# Patient Record
Sex: Male | Born: 1985 | Hispanic: No | Smoking: Never smoker
Health system: Southern US, Community
[De-identification: ages and names within clinical notes are randomized; demographics above are authoritative.]

## PROBLEM LIST (undated history)

## (undated) DIAGNOSIS — F319 Bipolar disorder, unspecified: Secondary | ICD-10-CM

## (undated) DIAGNOSIS — E785 Hyperlipidemia, unspecified: Secondary | ICD-10-CM

## (undated) DIAGNOSIS — I10 Essential (primary) hypertension: Secondary | ICD-10-CM

## (undated) HISTORY — PX: HERNIA REPAIR: SHX51

## (undated) HISTORY — PX: TYMPANOSTOMY TUBE PLACEMENT: SHX32

---

## 2008-09-16 ENCOUNTER — Emergency Department: Payer: Self-pay | Admitting: Emergency Medicine

## 2013-11-27 ENCOUNTER — Ambulatory Visit: Payer: Self-pay | Admitting: Family Medicine

## 2013-12-31 ENCOUNTER — Ambulatory Visit: Payer: Self-pay | Admitting: Urology

## 2014-03-15 ENCOUNTER — Encounter (HOSPITAL_COMMUNITY): Payer: Self-pay | Admitting: Emergency Medicine

## 2014-03-15 ENCOUNTER — Emergency Department (HOSPITAL_COMMUNITY)
Admission: EM | Admit: 2014-03-15 | Discharge: 2014-03-16 | Disposition: A | Discharge status: Home / Self Care | Payer: BC Managed Care – PPO | Attending: Dermatology | Admitting: Dermatology

## 2014-03-15 DIAGNOSIS — F332 Major depressive disorder, recurrent severe without psychotic features: Secondary | ICD-10-CM | POA: Diagnosis not present

## 2014-03-15 DIAGNOSIS — F329 Major depressive disorder, single episode, unspecified: Secondary | ICD-10-CM | POA: Diagnosis not present

## 2014-03-15 DIAGNOSIS — F419 Anxiety disorder, unspecified: Secondary | ICD-10-CM

## 2014-03-15 DIAGNOSIS — R45851 Suicidal ideations: Secondary | ICD-10-CM | POA: Diagnosis present

## 2014-03-15 DIAGNOSIS — F411 Generalized anxiety disorder: Secondary | ICD-10-CM | POA: Insufficient documentation

## 2014-03-15 DIAGNOSIS — Z880 Allergy status to penicillin: Secondary | ICD-10-CM | POA: Diagnosis not present

## 2014-03-15 DIAGNOSIS — F39 Unspecified mood [affective] disorder: Secondary | ICD-10-CM | POA: Diagnosis not present

## 2014-03-15 DIAGNOSIS — F3289 Other specified depressive episodes: Secondary | ICD-10-CM | POA: Insufficient documentation

## 2014-03-15 LAB — COMPREHENSIVE METABOLIC PANEL
ALT: 16 U/L (ref 0–53)
AST: 20 U/L (ref 0–37)
Albumin: 4.5 g/dL (ref 3.5–5.2)
Alkaline Phosphatase: 56 U/L (ref 39–117)
Anion gap: 15 (ref 5–15)
BILIRUBIN TOTAL: 0.5 mg/dL (ref 0.3–1.2)
BUN: 6 mg/dL (ref 6–23)
CO2: 24 meq/L (ref 19–32)
Calcium: 8.8 mg/dL (ref 8.4–10.5)
Chloride: 103 mEq/L (ref 96–112)
Creatinine, Ser: 0.94 mg/dL (ref 0.50–1.35)
Glucose, Bld: 86 mg/dL (ref 70–99)
POTASSIUM: 3.7 meq/L (ref 3.7–5.3)
SODIUM: 142 meq/L (ref 137–147)
Total Protein: 7.4 g/dL (ref 6.0–8.3)

## 2014-03-15 LAB — CBC
HCT: 44.9 % (ref 39.0–52.0)
HEMOGLOBIN: 15.5 g/dL (ref 13.0–17.0)
MCH: 29.9 pg (ref 26.0–34.0)
MCHC: 34.5 g/dL (ref 30.0–36.0)
MCV: 86.5 fL (ref 78.0–100.0)
PLATELETS: 265 10*3/uL (ref 150–400)
RBC: 5.19 MIL/uL (ref 4.22–5.81)
RDW: 12.8 % (ref 11.5–15.5)
WBC: 9.1 10*3/uL (ref 4.0–10.5)

## 2014-03-15 LAB — ETHANOL: Alcohol, Ethyl (B): <11 mg/dL (ref 0–11)

## 2014-03-15 LAB — SALICYLATE LEVEL: Salicylate Lvl: <2 mg/dL — ABNORMAL LOW (ref 2.8–20.0)

## 2014-03-15 LAB — ACETAMINOPHEN LEVEL

## 2014-03-15 MED ORDER — LORAZEPAM 1 MG PO TABS: 1.0000 mg | ORAL_TABLET | Freq: Three times a day (TID) | ORAL | Status: DC | PRN

## 2014-03-15 MED ORDER — ACETAMINOPHEN 325 MG PO TABS: 650.0000 mg | ORAL_TABLET | ORAL | Status: DC | PRN

## 2014-03-15 NOTE — ED Notes (Signed)
Pt inquired about time he will be here; pt states he no longer wants to wait asked for his clothes so he can leave.Md notified to come reassess

## 2014-03-15 NOTE — ED Provider Notes (Addendum)
CSN: [id number]     Arrival date & time 03/15/14  1914 History   First MD Initiated Contact with Patient 03/15/14 1950     Chief Complaint  Patient presents with  . Suicidal     (Consider location/radiation/quality/duration/timing/severity/associated sxs/prior Treatment) The history is provided by the patient.  pt c/o anxiety and depression, noting hx of same. States feels worse in the past couple of days, noting recent stressors, but will not provide specifics.  +vague thoughts of harm to self, denies any plan or intent to kill self. No HI.  Occasional etoh use, denies daily or heavy use.  Denies other drug use.  Denies any prescription or otc med use. States physical health recently at baseline. No recent illness. Normal appetite.      History reviewed. No pertinent past medical history. Past Surgical History  Procedure Laterality Date  . Hernia repair     History reviewed. No pertinent family history. History  Substance Use Topics  . Smoking status: Never Smoker   . Smokeless tobacco: Not on file  . Alcohol Use: Yes    Review of Systems  Constitutional: Negative for fever.  HENT: Negative for sore throat.   Eyes: Negative for redness.  Respiratory: Negative for shortness of breath.   Cardiovascular: Negative for chest pain.  Gastrointestinal: Negative for vomiting, abdominal pain and diarrhea.  Genitourinary: Negative for flank pain.  Musculoskeletal: Negative for back pain and neck pain.  Skin: Negative for rash.  Neurological: Negative for headaches.  Hematological: Does not bruise/bleed easily.  Psychiatric/Behavioral: Positive for dysphoric mood. The patient is nervous/anxious.       Allergies  Codeine and Penicillins  Home Medications   Prior to Admission medications   Not on File   BP 130/79  Pulse 60  Temp(Src) 98.9 F (37.2 C) (Oral)  Resp 18  SpO2 100% Physical Exam  Nursing note and vitals reviewed. Constitutional: He is oriented to person,  place, and time. He appears well-developed and well-nourished. No distress.  HENT:  Head: Atraumatic.  Mouth/Throat: Oropharynx is clear and moist.  Eyes: Conjunctivae are normal. Pupils are equal, round, and reactive to light.  Neck: Neck supple. No tracheal deviation present.  Cardiovascular: Normal rate, regular rhythm, normal heart sounds and intact distal pulses.   Pulmonary/Chest: Effort normal and breath sounds normal. No accessory muscle usage. No respiratory distress.  Abdominal: Soft. Bowel sounds are normal. He exhibits no distension. There is no tenderness.  Musculoskeletal: Normal range of motion.  Neurological: He is alert and oriented to person, place, and time.  Steady gait. No tremor or shakes.   Skin: Skin is warm and dry. He is not diaphoretic.  Psychiatric:  Appears mildly anxious. +recent suicidal thoughts, no plan, no attempt to harm self.     ED Course  Procedures (including critical care time) Labs Review  Results for orders placed during the hospital encounter of 03/15/14  ACETAMINOPHEN LEVEL      Result Value Ref Range   Acetaminophen (Tylenol), Serum <15.0  10 - 30 ug/mL  CBC      Result Value Ref Range   WBC 9.1  4.0 - 10.5 K/uL   RBC 5.19  4.22 - 5.81 MIL/uL   Hemoglobin 15.5  13.0 - 17.0 g/dL   HCT 56.2  13.0 - 86.5 %   MCV 86.5  78.0 - 100.0 fL   MCH 29.9  26.0 - 34.0 pg   MCHC 34.5  30.0 - 36.0 g/dL   RDW 12.8  11.5 - 15.5 %   Platelets 265  150 - 400 K/uL  COMPREHENSIVE METABOLIC PANEL      Result Value Ref Range   Sodium 142  137 - 147 mEq/L   Potassium 3.7  3.7 - 5.3 mEq/L   Chloride 103  96 - 112 mEq/L   CO2 24  19 - 32 mEq/L   Glucose, Bld 86  70 - 99 mg/dL   BUN 6  6 - 23 mg/dL   Creatinine, Ser 1.610.94  0.50 - 1.35 mg/dL   Calcium 8.8  8.4 - 09.610.5 mg/dL   Total Protein 7.4  6.0 - 8.3 g/dL   Albumin 4.5  3.5 - 5.2 g/dL   AST 20  0 - 37 U/L   ALT 16  0 - 53 U/L   Alkaline Phosphatase 56  39 - 117 U/L   Total Bilirubin 0.5  0.3 - 1.2  mg/dL   GFR calc non Af Amer >90  >90 mL/min   GFR calc Af Amer >90  >90 mL/min   Anion gap 15  5 - 15  ETHANOL      Result Value Ref Range   Alcohol, Ethyl (B) <11  0 - 11 mg/dL  SALICYLATE LEVEL      Result Value Ref Range   Salicylate Lvl <2.0 (*) 2.8 - 20.0 mg/dL      MDM  Labs.  Reviewed nursing notes and prior charts for additional history.   Psych team consulted.  Recheck, pt content, alert, awaiting psych team eval.   Signed out to oncoming provider, dispo per psych team.  0005 checked on psych team/TTS consult - pt still waiting, informed of delay, nurse called TTS to instruct that they assess as soon as able/first.  Signed out to Dr Nicanor AlconPalumbo that psych eval remains pending, to follow up with their assessment once complete, dispo per psych team.        Suzi RootsKevin E Freddy Kinne, MD 03/16/14 0009

## 2014-03-15 NOTE — ED Notes (Signed)
Presents with thoughts of harming self, began Saturday, endorses stress recently and past thoughts. Denies plan to harm self and plan to harm others. Cooperative, alert. Poor eye contact.

## 2014-03-15 NOTE — ED Notes (Signed)
MD at bedside. 

## 2014-03-16 DIAGNOSIS — R45851 Suicidal ideations: Secondary | ICD-10-CM

## 2014-03-16 DIAGNOSIS — F332 Major depressive disorder, recurrent severe without psychotic features: Secondary | ICD-10-CM

## 2014-03-16 DIAGNOSIS — F411 Generalized anxiety disorder: Secondary | ICD-10-CM

## 2014-03-16 LAB — RAPID URINE DRUG SCREEN, HOSP PERFORMED
AMPHETAMINES: NOT DETECTED
BENZODIAZEPINES: NOT DETECTED
Barbiturates: NOT DETECTED
Cocaine: NOT DETECTED
Opiates: NOT DETECTED
Tetrahydrocannabinol: NOT DETECTED

## 2014-03-16 NOTE — ED Notes (Signed)
Pt belongings returned to him. He has signed recipt

## 2014-03-16 NOTE — BH Assessment (Signed)
Tele Assessment Note   Carlos [name] is an 28 y.o. male.  -Clinician called to talk to Dr. Denton [name] but he had left.  Dr. Nicanor [name] said that [facility] thought that patient would need to be seen by psychiatry in AM after being assessed.  Did not feel comfortable with d/c'ing patient with a no-harm contract.  Patient is in [employer] [place] [name]).  At one point he wanted to leave and Dr. Nicanor [name] let him know that he could be IVC'ed if he attempted to leave.  Patient has been calm and cooperative if a little anxious to leave at times.  Dr. 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  Concern about patient's access to guns is a concern.  Patient reports that he came to [facility] because of some fleeting thoughts of self harm.  He has been under a lot of stress over the last few days due to relationship problems.  He says that he was alarm that he would have these thoughts and came in voluntarily.  Patient denies plan or intention to harm self at this time.  Describes these thoughts as "fleeting."  Also says that he has racing thoughts and tends to dwell on what has transpired over the last few days.  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  He does have access to firearms in the home and his Armed forces logistics/support/administrative officer.  Patient denies any HI or A/V hallucinations.  He has been divorced for a year and admitted it was due to infidelity on his part.  He has a 60 year old son.  Patient has been seeing the woman that he stepped out on his wife for.  He has also indicated to his ex wife that he may want to get back together with her.  This past Saturday he told his ex wife that he definitely wanted to stay with the girlfriend.  Ex wife became upset and threatened more restrictions on seeing the child and that she would take patient to court for more child support.  She also contacted his  girlfriend and sent her the texts that they had exchanged over the last year.  The girlfriend then broke up with him.  The ex wife and girlfriend both then told him that they would make sure he lost his job.  He talked to his superiors at work about this situation and they assured him that he would not lose his job over this.  Patient said that he has not eaten much and has not gotten much sleep in the last few days.  He does say that he has a friend that he can call at any time who will listen and help him.  He also says that he can go back to the EAP counselor that was at [facility] (thought the counselor's name was Carlos [name]) that he saw a few times last year during the divorce.  Patient says that he has always had some trouble with anxiety but is not currently on medication.  Patient says that he can currently contract for safety and even said that his supervisor (officer Carlos [name]) has offered to store his firearms in his gun safe at his house.  Patient is anxious about staying in the ED for long because of concern over his job.  He understands that he is to see the psychiatrist or psychiatric PA in AM on  08/11.  Axis I: Generalized Anxiety Disorder Axis II: Deferred Axis III: History reviewed. No pertinent past medical history. Axis IV: other psychosocial or environmental problems Axis V: 51-60 moderate symptoms  Past Medical History: History reviewed. No pertinent past medical history.  Past Surgical History  Procedure Laterality Date  . Hernia repair      Family History: History reviewed. No pertinent family history.  Social History:  reports that he has never smoked. He does not have any smokeless tobacco history on file. He reports that he drinks alcohol. He reports that he does not use illicit drugs.  Additional Social History:  Alcohol / Drug Use Pain Medications: See PTA medication list Prescriptions: See PTA medication list Over the Counter: See PTA medication list History  of alcohol / drug use?: Yes Substance #1 Name of Substance 1: ETOH (beer) 1 - Age of First Use: 28 years of age 9 - Amount (size/oz): 1-2 beers at a time 1 - Frequency: 2-3x/W 1 - Duration: On-going 1 - Last Use / Amount: 08/08  Two beers  CIWA: CIWA-Ar BP: 122/92 mmHg Pulse Rate: 83 COWS:    PATIENT STRENGTHS: (choose at least two) Ability for insight Average or above average intelligence Capable of independent living Communication skills General fund of knowledge Work skills  Allergies:  Allergies  Allergen Reactions  . Codeine Rash  . Penicillins Rash    Home Medications:  (Not in a hospital admission)  OB/GYN Status:  No LMP for male patient.  General Assessment Data [facility] Is this a Tele or Face-to-Face Assessment?: Tele Assessment Is this an Initial Assessment or a Re-assessment for this encounter?: Initial Assessment Living Arrangements: Alone Can pt return to current living arrangement?: Yes Admission Status: Voluntary Is patient capable of signing voluntary admission?: Yes Transfer from: Acute Hospital Referral Source: Self/Family/Friend     [facility] Living Arrangements: Alone Name of Psychiatrist: None Name of Therapist: EAP counselor at [facility]     Risk to self with the past 6 months Suicidal Ideation: No Suicidal Intent: No Is patient at risk for suicide?: No (Pt denies intent or plan.  Does have access to guns however.) Suicidal Plan?: No Access to Means: Yes Specify Access to Suicidal Means: Pt has access to guns. What has been your use of drugs/alcohol within the last 12 months?: N/A Previous Attempts/Gestures: No How many times?: 0 Other Self Harm Risks: None Triggers for Past Attempts: None known Intentional Self Injurious Behavior: None Family Suicide History: No Recent stressful life event(s): Divorce;Loss (Comment) (Loss of relationship) Persecutory voices/beliefs?: No Depression:  Yes Depression Symptoms: Despondent;Insomnia;Loss of interest in usual pleasures Substance abuse history and/or treatment for substance abuse?: No Suicide prevention information given to non-admitted patients: Not applicable  Risk to Others within the past 6 months Homicidal Ideation: No Thoughts of Harm to Others: No Current Homicidal Intent: No Current Homicidal Plan: No Access to Homicidal Means: No Identified Victim: No one History of harm to others?: No Assessment of Violence: None Noted Violent Behavior Description: N/A (Pt calm and cooperative.) Does patient have access to weapons?: Yes (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.) Criminal Charges Pending?: No Does patient have a court date: No  Psychosis Hallucinations: None noted Delusions: None noted  Mental Status Report Appear/Hygiene: Unremarkable;In scrubs Eye Contact: Good Motor Activity: Freedom of movement;Unremarkable Speech: Logical/coherent Level of Consciousness: Alert Mood: Anxious;Depressed Affect: Anxious;Depressed Anxiety Level: Moderate Thought Processes: Coherent;Relevant Judgement: Unimpaired Orientation: Person;Place;Time;Situation Obsessive Compulsive  Thoughts/Behaviors: None  Cognitive Functioning Concentration: Decreased Memory: Recent Intact;Remote Intact IQ: Average Insight: Good Impulse Control: Good Appetite: Fair Weight Loss: 0 Weight Gain: 0 Sleep: Decreased Total Hours of Sleep: 5 Vegetative Symptoms: None  ADLScreening [facility] Assessment Services) Patient's cognitive ability adequate to safely complete daily activities?: Yes Patient able to express need for assistance with ADLs?: Yes Independently performs ADLs?: Yes (appropriate for developmental age)  Prior Inpatient Therapy Prior Inpatient Therapy: No Prior Therapy Dates: N/A Prior Therapy Facilty/Provider(s): N/A Reason for Treatment: N/A  Prior Outpatient Therapy Prior Outpatient Therapy: Yes Prior  Therapy Dates: Last year Prior Therapy Facilty/Provider(s): EAP provider through [facility] Reason for Treatment: marital problems  ADL Screening (condition at time of admission) Patient's cognitive ability adequate to safely complete daily activities?: Yes Is the patient deaf or have difficulty hearing?: No Does the patient have difficulty seeing, even when wearing glasses/contacts?: No Does the patient have difficulty concentrating, remembering, or making decisions?: No Patient able to express need for assistance with ADLs?: Yes Does the patient have difficulty dressing or bathing?: No Independently performs ADLs?: Yes (appropriate for developmental age) Does the patient have difficulty walking or climbing stairs?: No Weakness of Legs: None Weakness of Arms/Hands: None  Home Assistive Devices/Equipment Home Assistive Devices/Equipment: None    Abuse/Neglect Assessment (Assessment to be complete while patient is alone) Physical Abuse: Denies Verbal Abuse: Denies Sexual Abuse: Denies Exploitation of patient/patient's resources: Denies Self-Neglect: Denies Values / Beliefs Cultural Requests During Hospitalization: None Spiritual Requests During Hospitalization: None   Advance Directives (For Healthcare) Advance Directive: Patient does not have advance directive;Patient would not like information Pre-existing out of facility DNR order (yellow form or pink MOST form): No Nutrition Screen- [facility] Adult/[facility]/AP Patient's home diet: Regular  Additional Information 1:1 In Past 12 Months?: No CIRT Risk: No Elopement Risk: No Does patient have medical clearance?: Yes     Disposition:  Disposition Initial Assessment Completed for this Encounter: Yes Disposition of Patient: Other dispositions Other disposition(s): Other (Comment) Karleen Hampshire recommended psychiatric eval in AM on 08/11)  Alexandria Lodge 03/16/2014 7:06 AM

## 2014-03-16 NOTE — ED Notes (Signed)
Pt received phone call from [place]. Talked for aprox 5 mins

## 2014-03-16 NOTE — ED Notes (Signed)
[facility] HAS FINISHED THEIR CONSULT. NOW AWAITING DISPOSITION

## 2014-03-16 NOTE — ED Provider Notes (Signed)
[facility] and cleared for discharge. 1230  Hurman HornJohn M Callahan Peddie, MD 03/23/14 1321

## 2014-03-16 NOTE — ED Notes (Signed)
DR Elsie SaasJONNALAGADDA IN TO SEE PT

## 2014-03-16 NOTE — Consult Note (Signed)
[facility] Face-to-Face Psychiatry Consult   Reason for Consult:  Depression and suicidal ideation Referring Physician:  Dr. Basilio Cairo Carlos [name] is an 28 y.o. male. Total Time spent with patient: 45 minutes  Assessment: AXIS I:  Generalized Anxiety Disorder and Major Depression, Recurrent severe AXIS II:  Deferred AXIS III:  History reviewed. No pertinent past medical history. AXIS IV:  other psychosocial or environmental problems, problems related to social environment, problems with primary support group and Relationship problems AXIS V:  41-50 serious symptoms  Plan:  Case discussed with Dr. Sharon Mt and staff RN Recommend out patient psychiatric med management and individual therapist Left Message at [phone], EAP Hassel Neth, who requested to contact regarding his case. Patient is given verbal consent. Provided no medications during this visit but suggested Abilify for mood disorder No evidence of imminent risk to self or others at present.   Patient does not meet criteria for psychiatric inpatient admission. Supportive therapy provided about ongoing stressors. Discussed crisis plan, support from social network, calling 911, coming to the Emergency Department, and calling Suicide Hotline. Appreciate psychiatric consultation Please contact [phone] if needs further assistance  Subjective:   Carlos [name] is a 28 y.o. male patient admitted with Depression and suicidal ideation.  HPI:  Patient stated that he came to [facility] with the help of his supervisor/friend because of some fleeting thoughts of self harm and stress of relationship over one year. Patient stated that he has been acting out of his character lying both to his ex wife and girl friend and lost trust and they are warning to report to his job and broke the relationship. He has family history of completed suicide by his great uncle and anxiety in his dad. Patient has denied symptoms of homicidal ideation and  psychosis and willing to follow up with his counselor Louie Casa and also willing to seek out patient psychiatric management in [place]. Patient has contracted for safety at this time. He has passive suicidal thoughts with racing thoughts over the last few days. 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 He does have access to firearms in the home and his Administrator. Patient denies any HI or A/V hallucinations. He has been divorced for a year and admitted it was due to infidelity on his part. He has a 47 year old son who he has been seeing four days every two weeks. He talked to his superiors at work about this situation and they assured him that he would not lose his job over this.  Patient has disturbed sleep and appetite. He has a friend that he can call at any time who will listen and support him. He can go back to the EAP counselor that was at [facility] (thought the counselor's name was Louie Casa) that he saw a few times last year during the divorce. Patient says that he can currently contract for safety and even said that his supervisor (officer Lorel Monaco) has offered to store his firearms in his gun safe at his house. He has no history of drug abuse.   HPI Elements:   Location:  depression. Quality:  moderate. Severity:  acute. Timing:  conflict with relationship. Duration:  one year. Context:  broke up relationship with girl friend.  Past Psychiatric History: History reviewed. No pertinent past medical history.  reports that he has never smoked. He does not have any smokeless tobacco history on file. He reports that he drinks alcohol. He reports that he does  not use illicit drugs. History reviewed. No pertinent family history. Family History Substance Abuse: No Family Supports: Yes, List: (Mother) Living Arrangements: Alone Can pt return to current living arrangement?: Yes Abuse/Neglect [facility]) Physical Abuse: Denies Verbal Abuse: Denies Sexual Abuse:  Denies Allergies:   Allergies  Allergen Reactions  . Codeine Rash  . Penicillins Rash    ACT Assessment Complete:  Yes:    Educational Status    Risk to Self: Risk to self with the past 6 months Suicidal Ideation: No Suicidal Intent: No Is patient at risk for suicide?: No (Pt denies intent or plan.  Does have access to guns however.) Suicidal Plan?: No Access to Means: Yes Specify Access to Suicidal Means: Pt has access to guns. What has been your use of drugs/alcohol within the last 12 months?: N/A Previous Attempts/Gestures: No How many times?: 0 Other Self Harm Risks: None Triggers for Past Attempts: None known Intentional Self Injurious Behavior: None Family Suicide History: No Recent stressful life event(s): Divorce;Loss (Comment) (Loss of relationship) Persecutory voices/beliefs?: No Depression: Yes Depression Symptoms: Despondent;Insomnia;Loss of interest in usual pleasures Substance abuse history and/or treatment for substance abuse?: No Suicide prevention information given to non-admitted patients: Not applicable  Risk to Others: Risk to Others within the past 6 months Homicidal Ideation: No Thoughts of Harm to Others: No Current Homicidal Intent: No Current Homicidal Plan: No Access to Homicidal Means: No Identified Victim: No one History of harm to others?: No Assessment of Violence: None Noted Violent Behavior Description: N/A (Pt calm and cooperative.) Does patient have access to weapons?: Yes (Comment) 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.) Criminal Charges Pending?: No Does patient have a court date: No  Abuse: Abuse/Neglect Assessment (Assessment to be complete while patient is alone) Physical Abuse: Denies Verbal Abuse: Denies Sexual Abuse: Denies Exploitation of patient/patient's resources: Denies Self-Neglect: Denies  Prior Inpatient Therapy: Prior Inpatient Therapy Prior Inpatient Therapy: No Prior Therapy Dates: N/A Prior Therapy  Facilty/Provider(s): N/A Reason for Treatment: N/A  Prior Outpatient Therapy: Prior Outpatient Therapy Prior Outpatient Therapy: Yes Prior Therapy Dates: Last year Prior Therapy Facilty/Provider(s): EAP provider through [facility] Reason for Treatment: marital problems  Additional Information: Additional Information 1:1 In Past 12 Months?: No CIRT Risk: No Elopement Risk: No Does patient have medical clearance?: Yes   Objective: Blood pressure 122/92, pulse 83, temperature 98.9 F (37.2 C), temperature source Oral, resp. rate 18, SpO2 99.00%.There is no height or weight on file to calculate BMI. Results for orders placed during the hospital encounter of 03/15/14 (from the past 72 hour(s))  ACETAMINOPHEN LEVEL     Status: None   Collection Time    03/15/14  7:48 PM      Result Value Ref Range   Acetaminophen (Tylenol), Serum <15.0  10 - 30 ug/mL   Comment:            THERAPEUTIC CONCENTRATIONS VARY     SIGNIFICANTLY. A RANGE OF 10-30     ug/mL MAY BE AN EFFECTIVE     CONCENTRATION FOR MANY PATIENTS.     HOWEVER, SOME ARE BEST TREATED     AT CONCENTRATIONS OUTSIDE THIS     RANGE.     ACETAMINOPHEN CONCENTRATIONS     >150 ug/mL AT 4 HOURS AFTER     INGESTION AND >50 ug/mL AT 12     HOURS AFTER INGESTION ARE     OFTEN ASSOCIATED WITH TOXIC     REACTIONS.  CBC     Status: None  Collection Time    03/15/14  7:48 PM      Result Value Ref Range   WBC 9.1  4.0 - 10.5 K/uL   RBC 5.19  4.22 - 5.81 MIL/uL   Hemoglobin 15.5  13.0 - 17.0 g/dL   HCT 44.9  39.0 - 52.0 %   MCV 86.5  78.0 - 100.0 fL   MCH 29.9  26.0 - 34.0 pg   MCHC 34.5  30.0 - 36.0 g/dL   RDW 12.8  11.5 - 15.5 %   Platelets 265  150 - 400 K/uL  COMPREHENSIVE METABOLIC PANEL     Status: None   Collection Time    03/15/14  7:48 PM      Result Value Ref Range   Sodium 142  137 - 147 mEq/L   Potassium 3.7  3.7 - 5.3 mEq/L   Chloride 103  96 - 112 mEq/L   CO2 24  19 - 32 mEq/L   Glucose, Bld 86  70 - 99 mg/dL   BUN 6   6 - 23 mg/dL   Creatinine, Ser 0.94  0.50 - 1.35 mg/dL   Calcium 8.8  8.4 - 10.5 mg/dL   Total Protein 7.4  6.0 - 8.3 g/dL   Albumin 4.5  3.5 - 5.2 g/dL   AST 20  0 - 37 U/L   ALT 16  0 - 53 U/L   Alkaline Phosphatase 56  39 - 117 U/L   Total Bilirubin 0.5  0.3 - 1.2 mg/dL   GFR calc non Af Amer >90  >90 mL/min   GFR calc Af Amer >90  >90 mL/min   Comment: (NOTE)     The eGFR has been calculated using the CKD EPI equation.     This calculation has not been validated in all clinical situations.     eGFR's persistently <90 mL/min signify possible Chronic Kidney     Disease.   Anion gap 15  5 - 15  ETHANOL     Status: None   Collection Time    03/15/14  7:48 PM      Result Value Ref Range   Alcohol, Ethyl (B) <11  0 - 11 mg/dL   Comment:            LOWEST DETECTABLE LIMIT FOR     SERUM ALCOHOL IS 11 mg/dL     FOR MEDICAL PURPOSES ONLY  SALICYLATE LEVEL     Status: Abnormal   Collection Time    03/15/14  7:48 PM      Result Value Ref Range   Salicylate Lvl <7.6 (*) 2.8 - 20.0 mg/dL  URINE RAPID DRUG SCREEN (HOSP PERFORMED)     Status: None   Collection Time    03/16/14  2:13 AM      Result Value Ref Range   Opiates NONE DETECTED  NONE DETECTED   Cocaine NONE DETECTED  NONE DETECTED   Benzodiazepines NONE DETECTED  NONE DETECTED   Amphetamines NONE DETECTED  NONE DETECTED   Tetrahydrocannabinol NONE DETECTED  NONE DETECTED   Barbiturates NONE DETECTED  NONE DETECTED   Comment:            DRUG SCREEN FOR MEDICAL PURPOSES     ONLY.  IF CONFIRMATION IS NEEDED     FOR ANY PURPOSE, NOTIFY LAB     WITHIN 5 DAYS.                LOWEST DETECTABLE LIMITS  FOR URINE DRUG SCREEN     Drug Class       Cutoff (ng/mL)     Amphetamine      1000     Barbiturate      200     Benzodiazepine   025     Tricyclics       486     Opiates          300     Cocaine          300     THC              50   Labs are reviewed and are pertinent for .  Current Facility-Administered  Medications  Medication Dose Route Frequency Provider Last Rate Last Dose  . acetaminophen (TYLENOL) tablet 650 mg  650 mg Oral Q4H PRN Mirna Mires, MD      . LORazepam (ATIVAN) tablet 1 mg  1 mg Oral Q8H PRN Mirna Mires, MD       No current outpatient prescriptions on file.    Psychiatric Specialty Exam: Physical Exam  ROS  Blood pressure 122/92, pulse 83, temperature 98.9 F (37.2 C), temperature source Oral, resp. rate 18, SpO2 99.00%.There is no height or weight on file to calculate BMI.  General Appearance: Guarded  Eye Contact::  Good  Speech:  Clear and Coherent  Volume:  Normal  Mood:  Anxious and Depressed  Affect:  Congruent and Depressed  Thought Process:  Coherent and Goal Directed  Orientation:  Full (Time, Place, and Person)  Thought Content:  Rumination  Suicidal Thoughts:  Yes.  without intent/plan  Homicidal Thoughts:  No  Memory:  Immediate;   Good Recent;   Good  Judgement:  Intact  Insight:  Fair  Psychomotor Activity:  Decreased  Concentration:  Good  Recall:  Good  Fund of Knowledge:Good  Language: Good  Akathisia:  NA  Handed:  Right  AIMS (if indicated):     Assets:  Communication Skills Desire for Improvement Financial Resources/Insurance Housing Intimacy Leisure Time [place] Talents/Skills Transportation Vocational/Educational  Sleep:      Musculoskeletal: Strength & Muscle Tone: within normal limits Gait & Station: normal Patient leans: N/A  Treatment Plan Summary: Daily contact with patient to assess and evaluate symptoms and progress in treatment Medication management Recommend out patient psychiatric med management and individual therapist Left Message at [phone], EAP Hassel Neth, who requested to contact regarding his case. Patient is given verbal consent.  Aamari Strawderman,JANARDHAHA R. 03/16/2014 10:45 AM

## 2014-03-16 NOTE — ED Notes (Signed)
Md request pt has TTS before leaving; pt agrees to stay [facility];

## 2014-03-16 NOTE — ED Notes (Signed)
Pt returned to RN desk to ask for clothes; states he is ready to leave; Notified Md; Md to reassess; Pt agrees to stay for TTS.

## 2014-03-16 NOTE — ED Notes (Signed)
Pt making a phone call to friend/family member at [employer] desk

## 2014-03-16 NOTE — ED Notes (Signed)
TTS in process 

## 2014-03-16 NOTE — ED Notes (Signed)
MD at bedside. 

## 2014-03-16 NOTE — Discharge Instructions (Signed)
You have signs of possible anxiety and/or depression. This is a very common problem.  °Be sure to call your caregiver and arrange for follow-up care as suggested by our staff. °RETURN IMMEDIATELY IF DEVELOP threat to harm self or others, suicidal or homicidal thoughts, hallucinations or confusion, unable to be cared for at home or uncontrolled behavior, or other concerns. °
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
## 2015-06-05 IMAGING — US US SCROTUM W/ DOPPLER COMPLETE
1 series · 13 of 25 positions shown · non-contrast
Comparison: 11/27/2013.

CLINICAL DATA: Right testicular mass palpated by the patient's
referring physician.

EXAM:
SCROTAL ULTRASOUND
DOPPLER ULTRASOUND OF THE TESTICLES
TECHNIQUE: Complete ultrasound examination of the testicles, epididymis, and
other scrotal structures was performed. Color and spectral Doppler
ultrasound were also utilized to evaluate blood flow to the
testicles.

[Series 1: us scrotum w/ doppler complete · 0.08mm/px · 13 of 83 slices shown]
[im 1/83]
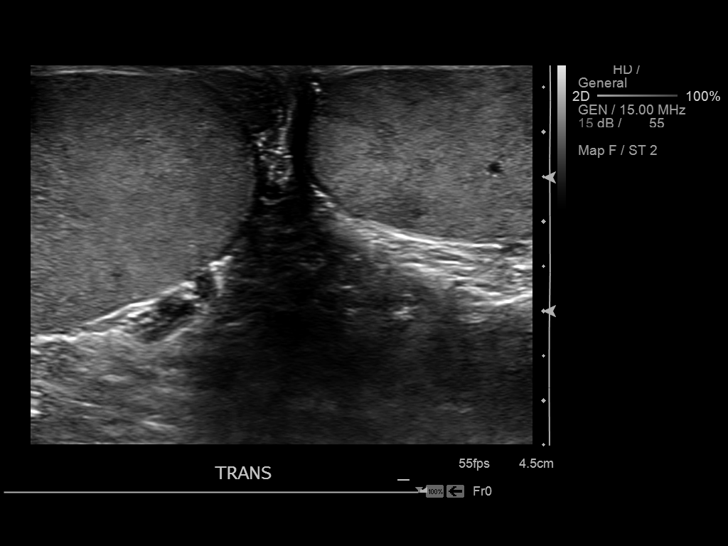
[im 7/83]
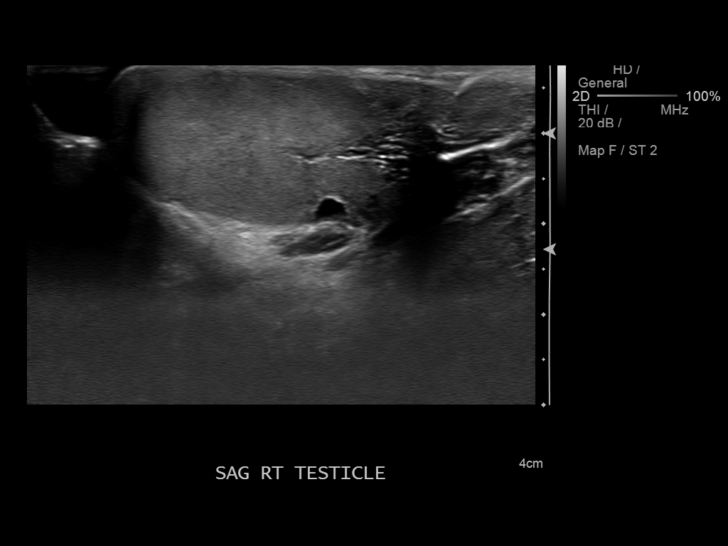
[im 14/83]
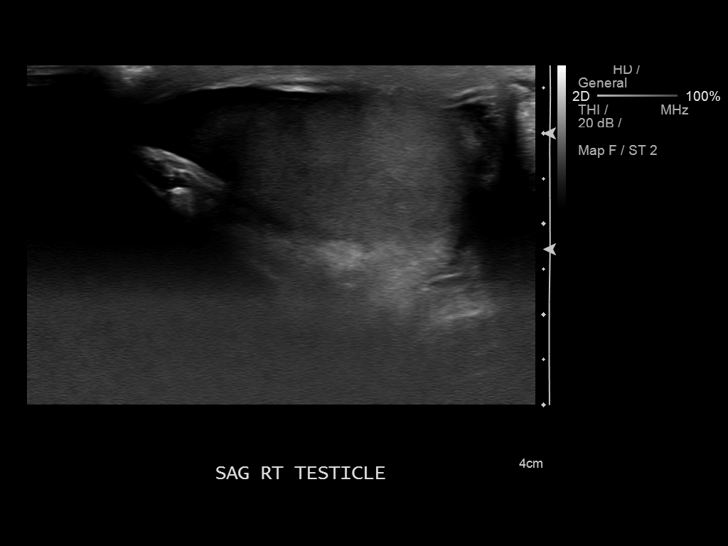
[im 21/83]
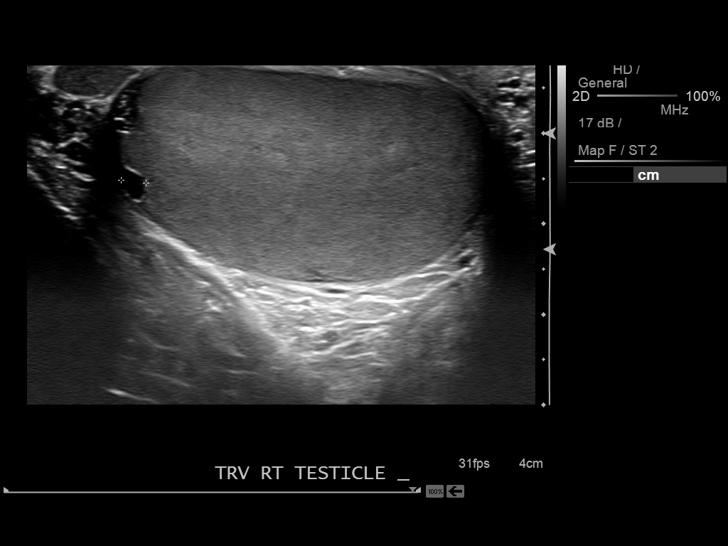
[im 28/83]
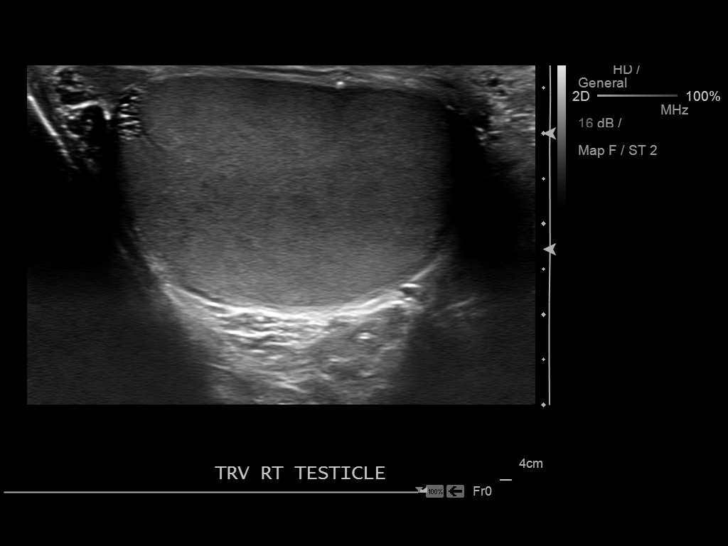
[im 35/83]
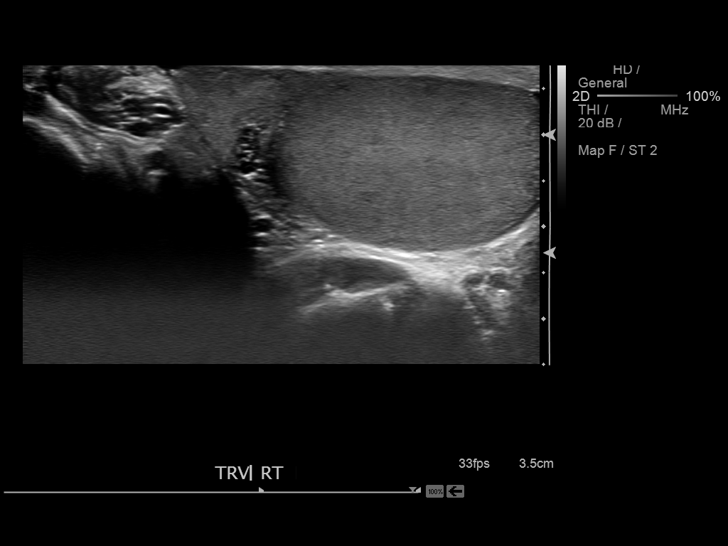
[im 42/83]
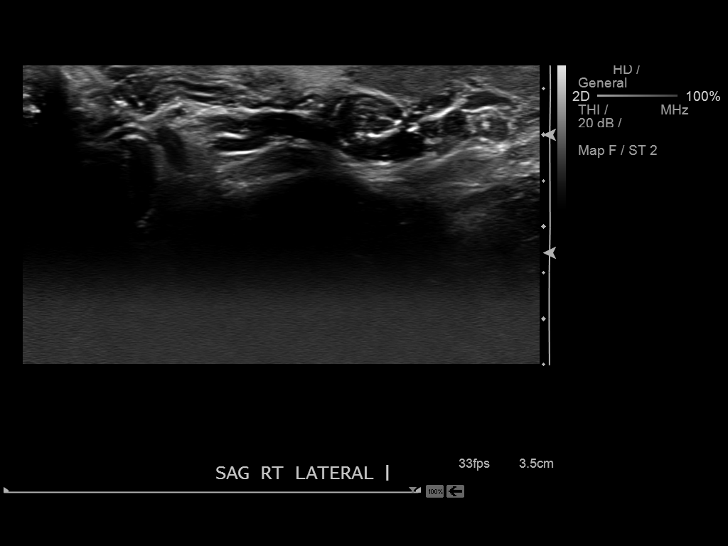
[im 48/83]
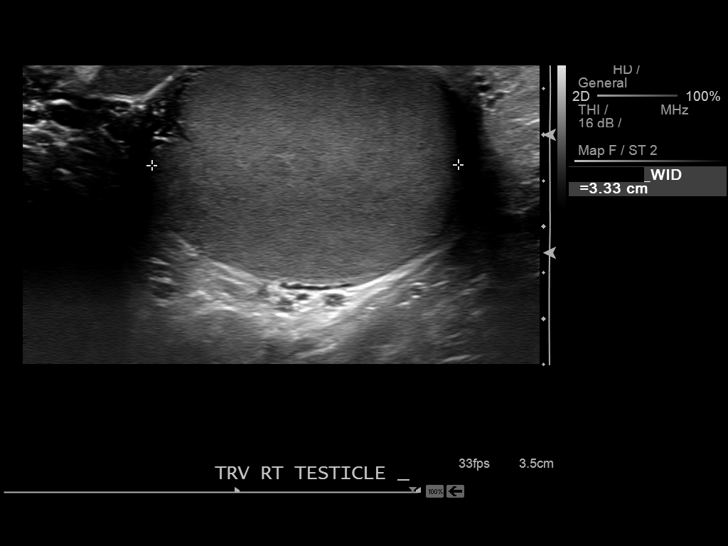
[im 55/83]
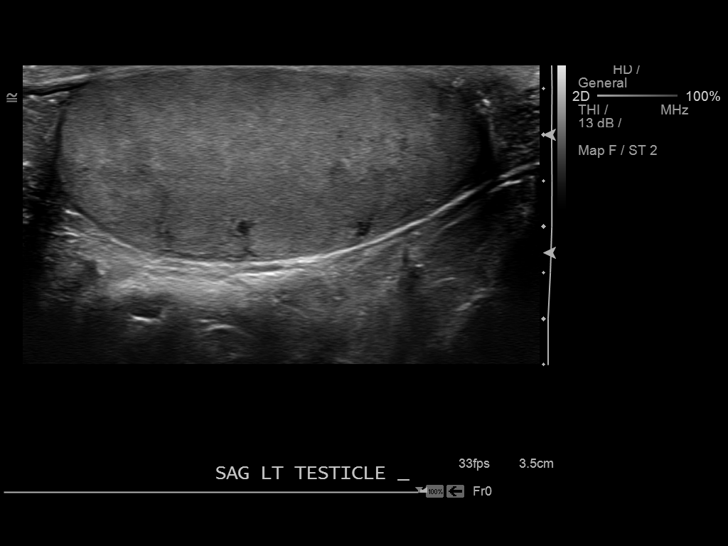
[im 62/83]
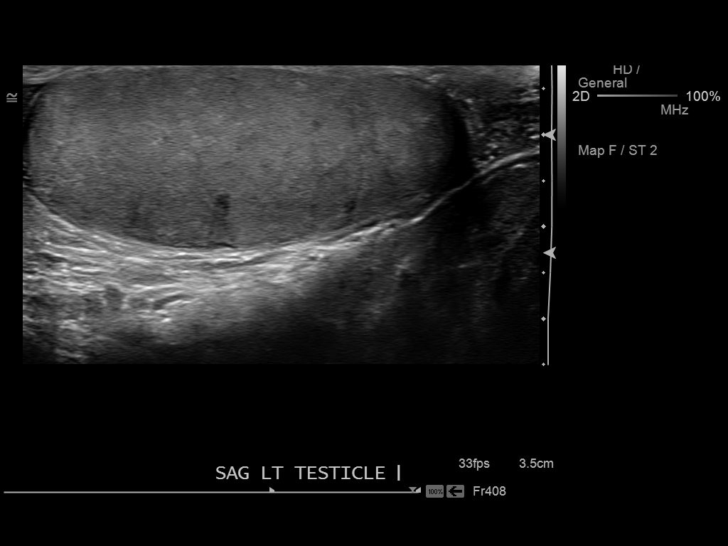
[im 69/83]
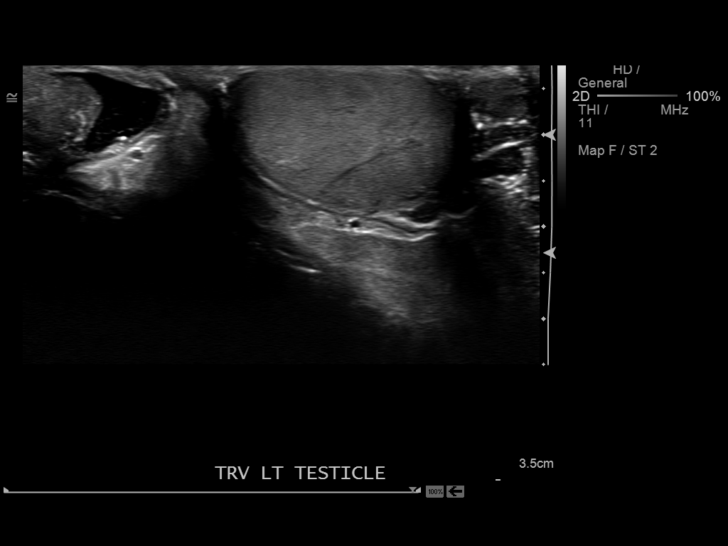
[im 76/83]
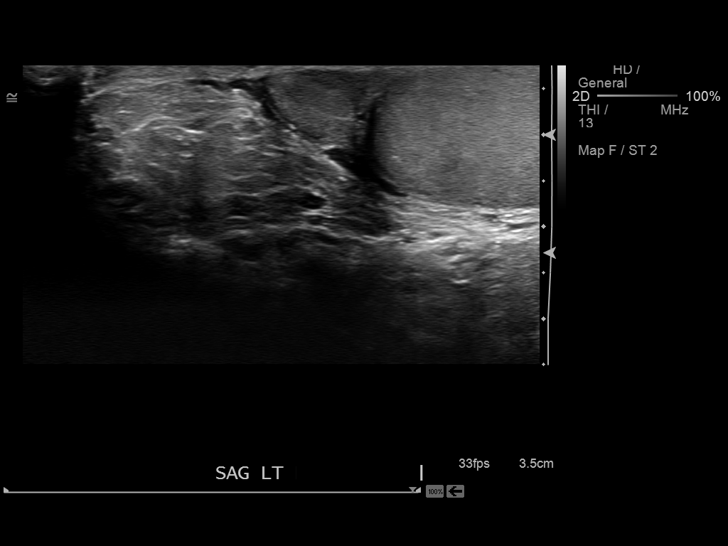
[im 83/83]
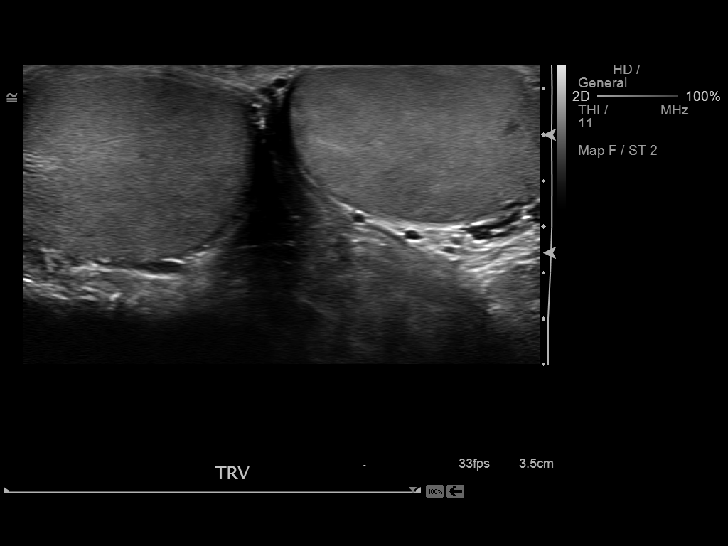

[13 of 25 positions shown; findings below may reference images not displayed]

FINDINGS: Right testicle

Measurements: 5.4 cm x 2.5 cm x 3.3 cm. There is a cyst along the
periphery, likely a tunica albuginea cyst. It measures 3.6 mm in
greatest dimension. No solid testicular mass. Normal overall
testicular echogenicity.

Left testicle

Measurements: 4.9 cm x 2 cm x 2.9 cm. No mass. Mildly heterogeneous
echogenicity similar to the prior study.

Right epididymis:  5 mm epididymal head cyst.  No other abnormality.

Left epididymis:  Normal in size and appearance.

Hydrocele:  None visualized.

Varicocele:  Left varicocele.

Pulsed Doppler interrogation of both testes demonstrates low
resistance arterial and venous waveforms bilaterally.
IMPRESSION: 1. Small, tunica albuginea cyst along the periphery of the right
testicle. No solid testicular mass.
2. Small right epididymal head cyst.
3. Left varicocele.
4. No other abnormalities

## 2016-09-18 DIAGNOSIS — Z0000 Encounter for general adult medical examination without abnormal findings: Secondary | ICD-10-CM | POA: Diagnosis not present

## 2016-09-18 DIAGNOSIS — E7800 Pure hypercholesterolemia, unspecified: Secondary | ICD-10-CM | POA: Diagnosis not present

## 2016-09-18 DIAGNOSIS — Z79899 Other long term (current) drug therapy: Secondary | ICD-10-CM | POA: Diagnosis not present

## 2016-10-18 DIAGNOSIS — I10 Essential (primary) hypertension: Secondary | ICD-10-CM | POA: Diagnosis not present

## 2017-01-30 DIAGNOSIS — R21 Rash and other nonspecific skin eruption: Secondary | ICD-10-CM | POA: Diagnosis not present

## 2017-01-30 DIAGNOSIS — R5383 Other fatigue: Secondary | ICD-10-CM | POA: Diagnosis not present

## 2017-01-30 DIAGNOSIS — S80262A Insect bite (nonvenomous), left knee, initial encounter: Secondary | ICD-10-CM | POA: Diagnosis not present

## 2017-03-21 DIAGNOSIS — J019 Acute sinusitis, unspecified: Secondary | ICD-10-CM | POA: Diagnosis not present

## 2018-01-10 DIAGNOSIS — E7800 Pure hypercholesterolemia, unspecified: Secondary | ICD-10-CM | POA: Diagnosis not present

## 2018-01-10 DIAGNOSIS — Z79899 Other long term (current) drug therapy: Secondary | ICD-10-CM | POA: Diagnosis not present

## 2018-01-10 DIAGNOSIS — Z0000 Encounter for general adult medical examination without abnormal findings: Secondary | ICD-10-CM | POA: Diagnosis not present

## 2018-01-10 DIAGNOSIS — I10 Essential (primary) hypertension: Secondary | ICD-10-CM | POA: Diagnosis not present

## 2018-07-16 DIAGNOSIS — Z3009 Encounter for other general counseling and advice on contraception: Secondary | ICD-10-CM | POA: Diagnosis not present

## 2018-08-21 ENCOUNTER — Other Ambulatory Visit: Payer: Self-pay | Admitting: Urology

## 2018-08-29 ENCOUNTER — Encounter (HOSPITAL_BASED_OUTPATIENT_CLINIC_OR_DEPARTMENT_OTHER): Payer: Self-pay | Admitting: *Deleted

## 2018-08-29 ENCOUNTER — Other Ambulatory Visit: Payer: Self-pay

## 2018-08-29 NOTE — Progress Notes (Signed)
Spoke with Carlos [name] after midnight food, clear liquuids until 815 am then [name]. Arrive 1215 pm 09-05-18 [facility] No meds to take girlfriend amber griffin driver Has surgery orders in epic Needs ista 4 and ekg

## 2018-09-05 ENCOUNTER — Ambulatory Visit (HOSPITAL_BASED_OUTPATIENT_CLINIC_OR_DEPARTMENT_OTHER): Admission: RE | Admit: 2018-09-05 | Payer: 59 | Source: Home / Self Care | Admitting: Urology

## 2018-09-05 HISTORY — DX: Hyperlipidemia, unspecified: E78.5

## 2018-09-05 HISTORY — DX: Bipolar disorder, unspecified: F31.9

## 2018-09-05 HISTORY — DX: Essential (primary) hypertension: I10

## 2018-09-05 SURGERY — VASECTOMY
Anesthesia: Monitor Anesthesia Care

## 2018-09-23 DIAGNOSIS — R5383 Other fatigue: Secondary | ICD-10-CM | POA: Diagnosis not present

## 2018-09-23 DIAGNOSIS — Z79899 Other long term (current) drug therapy: Secondary | ICD-10-CM | POA: Diagnosis not present

## 2018-10-29 DIAGNOSIS — G4733 Obstructive sleep apnea (adult) (pediatric): Secondary | ICD-10-CM | POA: Diagnosis not present

## 2018-11-25 DIAGNOSIS — G4733 Obstructive sleep apnea (adult) (pediatric): Secondary | ICD-10-CM | POA: Diagnosis not present

## 2018-12-09 DIAGNOSIS — Z302 Encounter for sterilization: Secondary | ICD-10-CM | POA: Diagnosis not present

## 2018-12-25 DIAGNOSIS — G4733 Obstructive sleep apnea (adult) (pediatric): Secondary | ICD-10-CM | POA: Diagnosis not present

## 2019-05-18 ENCOUNTER — Encounter (HOSPITAL_COMMUNITY): Payer: Self-pay | Admitting: Emergency Medicine
